# Patient Record
Sex: Male | Born: 1990 | Race: Black or African American | Hispanic: No | Marital: Single | Smoking: Current every day smoker
Health system: Southern US, Community
[De-identification: ages and names within clinical notes are randomized; demographics above are authoritative.]

---
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
## 2017-06-01 ENCOUNTER — Encounter (HOSPITAL_COMMUNITY): Payer: Self-pay | Admitting: Emergency Medicine

## 2017-06-01 ENCOUNTER — Emergency Department (HOSPITAL_COMMUNITY)
Admission: EM | Admit: 2017-06-01 | Discharge: 2017-06-02 | Payer: Self-pay | Attending: Emergency Medicine | Admitting: Emergency Medicine

## 2017-06-01 ENCOUNTER — Emergency Department (HOSPITAL_COMMUNITY): Payer: Self-pay

## 2017-06-01 DIAGNOSIS — Y9302 Activity, running: Secondary | ICD-10-CM | POA: Insufficient documentation

## 2017-06-01 DIAGNOSIS — X509XXA Other and unspecified overexertion or strenuous movements or postures, initial encounter: Secondary | ICD-10-CM | POA: Insufficient documentation

## 2017-06-01 DIAGNOSIS — F17200 Nicotine dependence, unspecified, uncomplicated: Secondary | ICD-10-CM | POA: Insufficient documentation

## 2017-06-01 DIAGNOSIS — Y998 Other external cause status: Secondary | ICD-10-CM | POA: Insufficient documentation

## 2017-06-01 DIAGNOSIS — S93401A Sprain of unspecified ligament of right ankle, initial encounter: Secondary | ICD-10-CM | POA: Insufficient documentation

## 2017-06-01 DIAGNOSIS — Y929 Unspecified place or not applicable: Secondary | ICD-10-CM | POA: Insufficient documentation

## 2017-06-01 MED ORDER — IBUPROFEN 800 MG PO TABS
800.0000 mg | ORAL_TABLET | Freq: Once | ORAL | Status: AC
  Administered 2017-06-02: 800 mg via ORAL
  Filled 2017-06-01: qty 1

## 2017-06-02 MED ORDER — IBUPROFEN 800 MG PO TABS: 800.0000 mg | ORAL_TABLET | Freq: Three times a day (TID) | ORAL | 0 refills | Status: AC

## 2017-06-02 NOTE — ED Provider Notes (Signed)
[facility]-EMERGENCY DEPT Provider Note   CSN: [id number] Arrival date & time: 06/01/17  2237     History   Chief Complaint Chief Complaint  Patient presents with  . Ankle Injury    HPI Alec Santos is a 26 y.o. male.  Patient presents to the ED with a chief complaint of ankle pain. He states that he rolled his right ankle while running away from police.  He reports pain with ambulation.  He has not taken anything for his symptoms.  He rates the pain as moderate.  It does not radiate. He denies any other symptoms.   The history is provided by the patient. No language interpreter was used.    History reviewed. No pertinent past medical history.  There are no active problems to display for this patient.   History reviewed. No pertinent surgical history.     Home Medications    Prior to Admission medications   Not on File    Family History History reviewed. No pertinent family history.  Social History Social History  Substance Use Topics  . Smoking status: Current Every Day Smoker  . Smokeless tobacco: Never Used  . Alcohol use Yes     Allergies   Patient has no known allergies.   Review of Systems Review of Systems  All other systems reviewed and are negative.    Physical Exam Updated Vital Signs BP 133/81 (BP Location: Right Arm)   Pulse 91   Temp 98.2 F (36.8 C) (Oral)   Resp 16   Ht 5\' 10"  (1.778 m)   Wt 61.2 kg (135 lb)   SpO2 100%   BMI 19.37 kg/m   Physical Exam Nursing note and vitals reviewed.  Constitutional: Pt appears well-developed and well-nourished. No distress.  HENT:  Head: Normocephalic and atraumatic.  Eyes: Conjunctivae are normal.  Neck: Normal range of motion.  Cardiovascular: Normal rate, regular rhythm. Intact distal pulses.   Capillary refill < 3 sec.  Pulmonary/Chest: Effort normal and breath sounds normal.  Musculoskeletal:  Right ankle Pt exhibits no TTP, no bony abnormality or  deformity.   ROM: 5/5  Strength: 5/5  Neurological: Pt  is alert. Coordination normal.  Sensation: 5/5 Skin: Skin is warm and dry. Pt is not diaphoretic.  No evidence of open wound or skin tenting Psychiatric: Pt has a normal mood and affect.     ED Treatments / Results  Labs (all labs ordered are listed, but only abnormal results are displayed) Labs Reviewed - No data to display  EKG  EKG Interpretation None       Radiology Dg Ankle Complete Right  Result Date: 06/02/2017 CLINICAL DATA:  Right ankle pain after twisting injury. EXAM: RIGHT ANKLE - COMPLETE 3+ VIEW COMPARISON:  None. FINDINGS: There is no evidence of fracture or dislocation. There is no evidence of arthropathy or other focal bone abnormality. Small tibiotalar joint effusion. Mild soft tissue edema. IMPRESSION: No fracture or dislocation. Small joint effusion and soft tissue edema. Electronically Signed   By: Rubye OaksMelanie  Ehinger M.D.   On: 06/02/2017 00:22    Procedures Procedures (including critical care time)  Medications Ordered in ED Medications  ibuprofen (ADVIL,MOTRIN) tablet 800 mg (800 mg Oral Given 06/02/17 0018)     Initial Impression / Assessment and Plan / ED Course  I have reviewed the triage vital signs and the nursing notes.  Pertinent labs & imaging results that were available during my care of the patient were reviewed by me  and considered in my medical decision making (see chart for details).     Patient X-Ray negative for obvious fracture or dislocation.  Pt advised to follow up with orthopedics. Patient given ankle brace and crutches while in ED, conservative therapy recommended and discussed. Patient will be discharged home & is agreeable with above plan. Returns precautions discussed. Pt appears safe for discharge.   Final Clinical Impressions(s) / ED Diagnoses   Final diagnoses:  Sprain of right ankle, unspecified ligament, initial encounter    New Prescriptions New  Prescriptions   IBUPROFEN (ADVIL,MOTRIN) 800 MG TABLET    Take 1 tablet (800 mg total) by mouth 3 (three) times daily.     Roxy Horseman, PA-C 06/02/17 0030    Charlynne Pander, MD 06/02/17 [phone]

## 2018-03-04 IMAGING — CR DG ANKLE COMPLETE 3+V*R*
3 series · 3 of 3 positions shown · non-contrast
Comparison: None.

CLINICAL DATA: Right ankle pain after twisting injury.

EXAM:
RIGHT ANKLE - COMPLETE 3+ VIEW

[x ankle ap right]
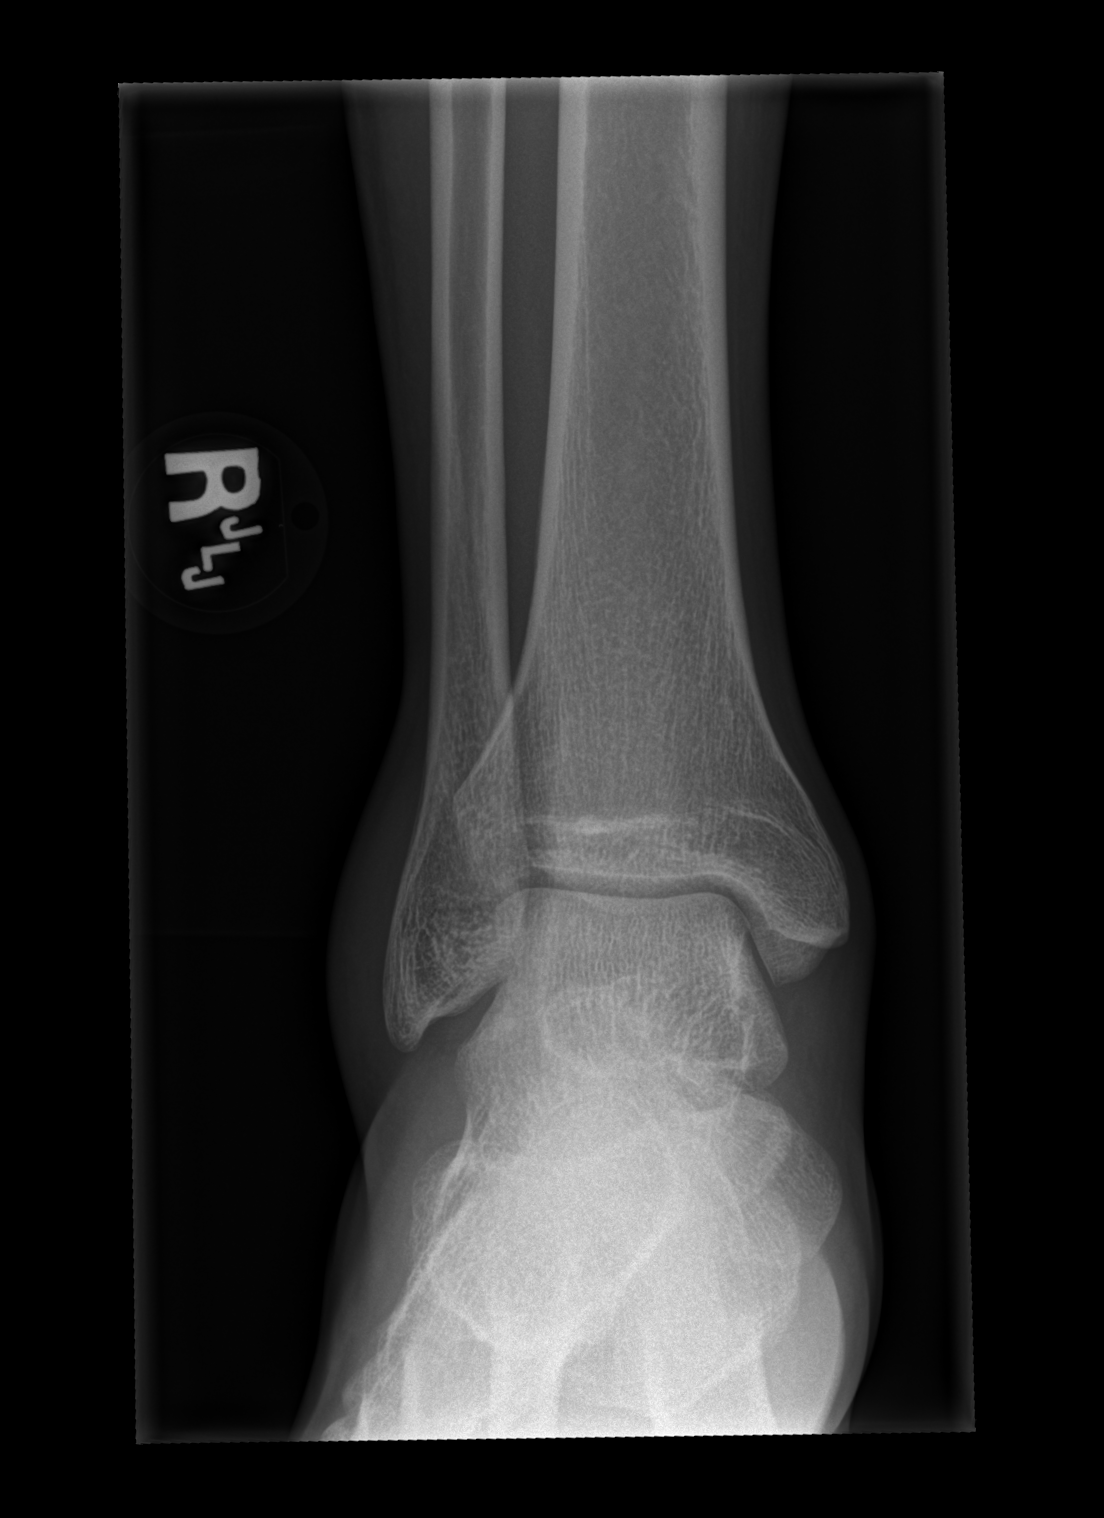

[x ankle obl right]
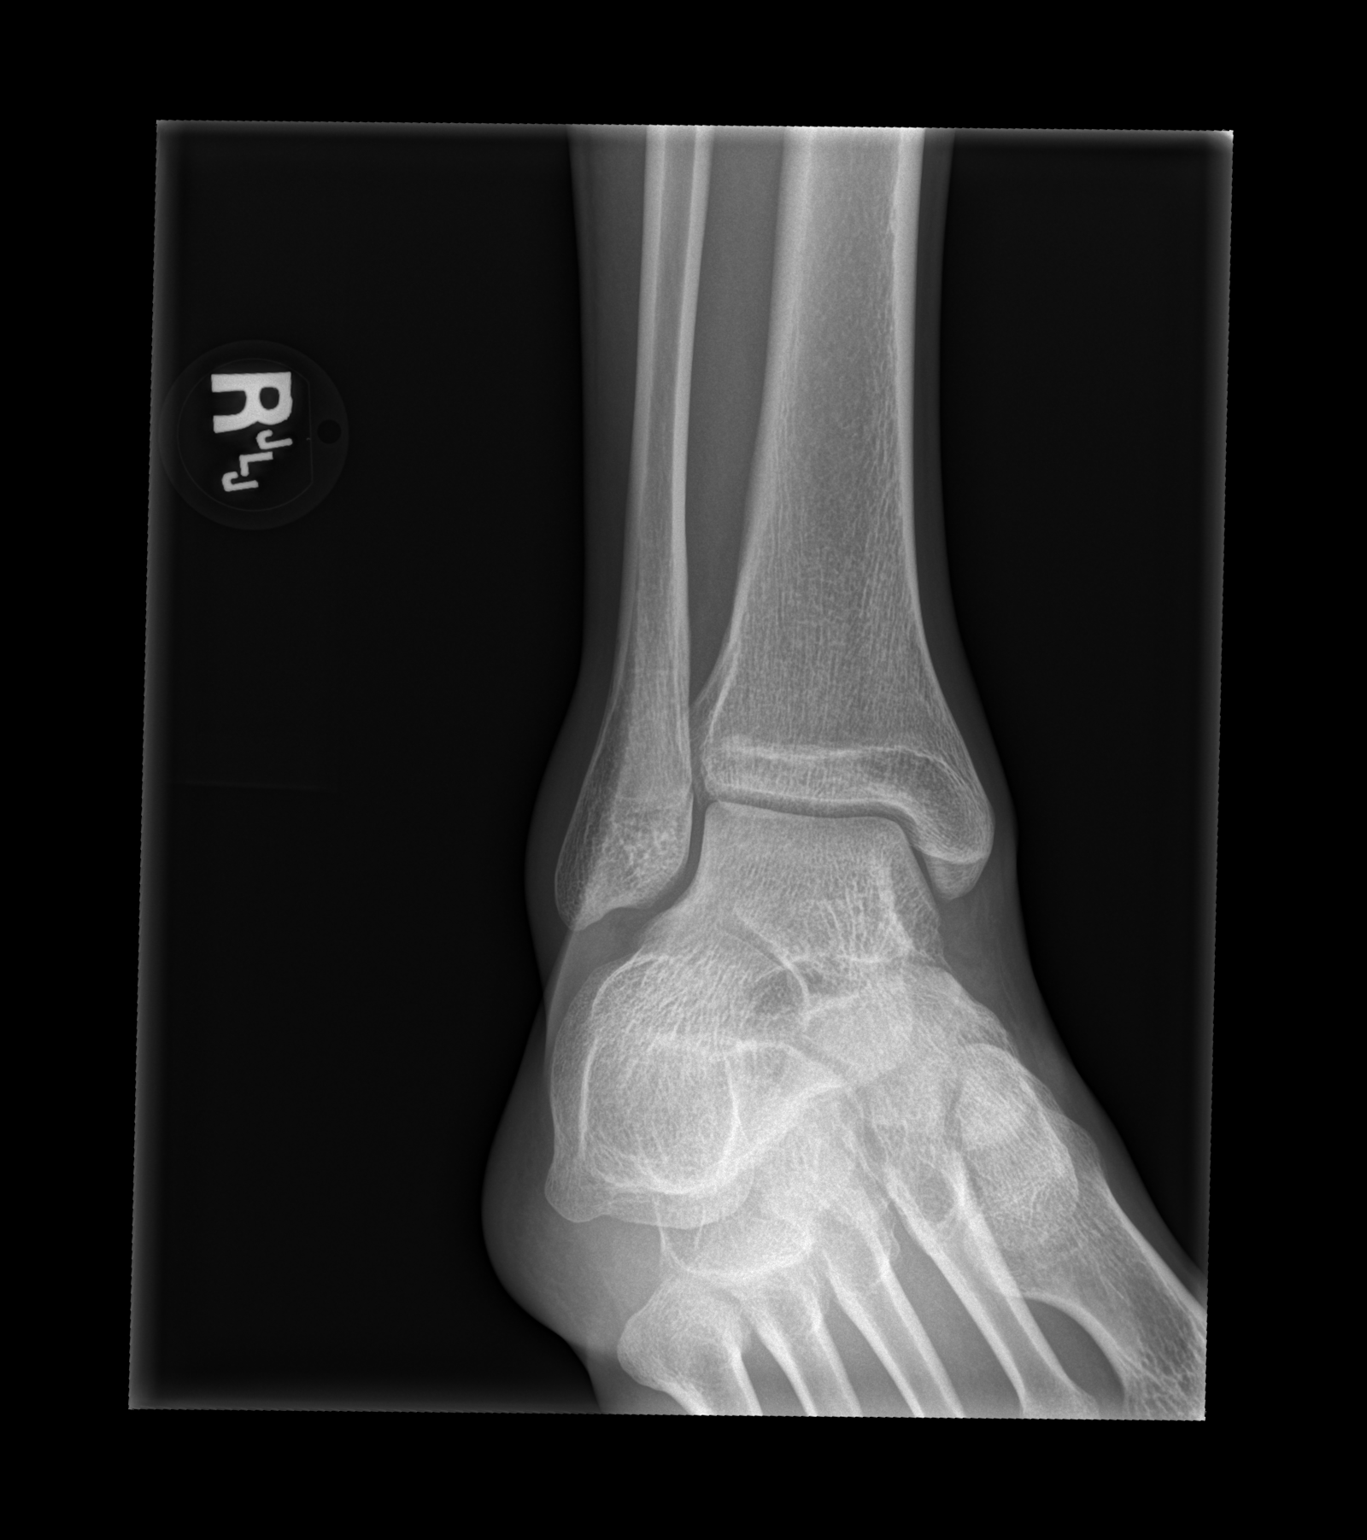

[x ankle lat right]
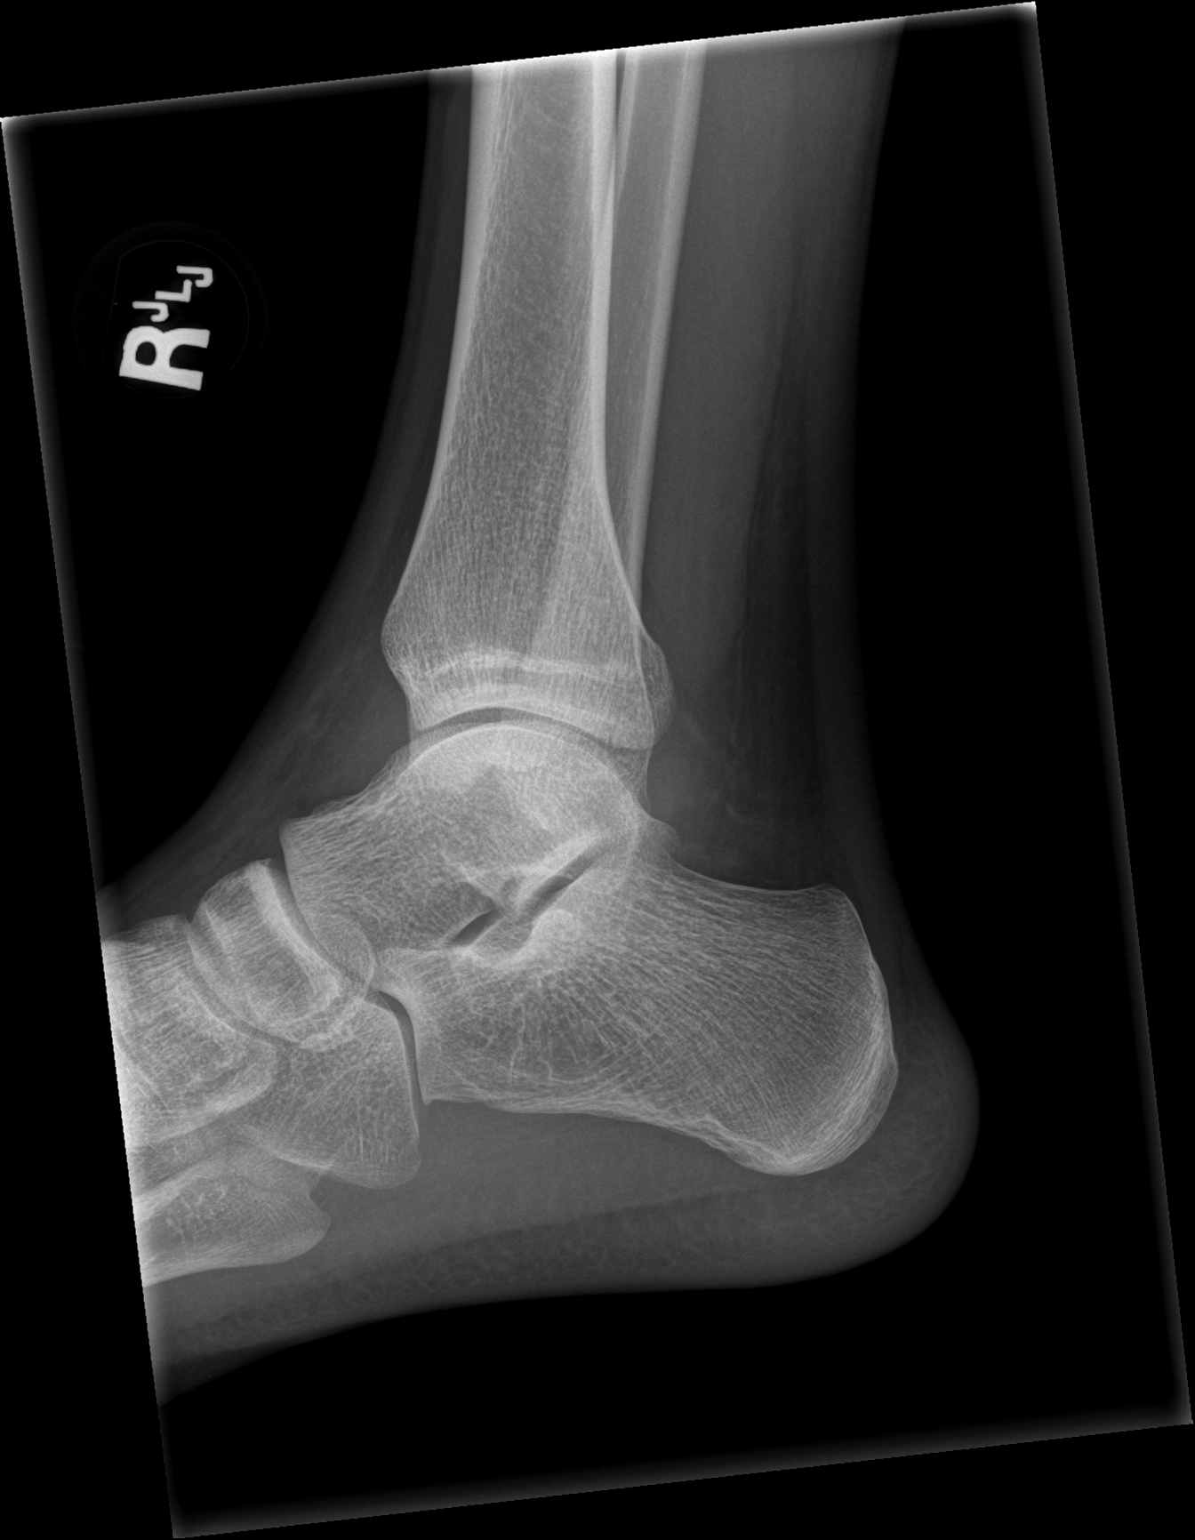

[3 of 3 positions shown; findings below may reference images not displayed]

FINDINGS: There is no evidence of fracture or dislocation. There is no
evidence of arthropathy or other focal bone abnormality. Small
tibiotalar joint effusion. Mild soft tissue edema.
IMPRESSION: No fracture or dislocation.

Small joint effusion and soft tissue edema.
